# Patient Record
Sex: Male | Born: 1980 | Race: White | Hispanic: No | Marital: Married
Health system: Southern US, Community
[De-identification: ages and names within clinical notes are randomized; demographics above are authoritative.]

---

## 2021-01-09 ENCOUNTER — Other Ambulatory Visit: Payer: Self-pay

## 2021-01-09 ENCOUNTER — Emergency Department (HOSPITAL_COMMUNITY)
Admission: EM | Admit: 2021-01-09 | Discharge: 2021-01-10 | Disposition: A | Discharge status: Home / Self Care | Payer: BC Managed Care – PPO | Attending: Emergency Medicine | Admitting: Emergency Medicine

## 2021-01-09 ENCOUNTER — Emergency Department (HOSPITAL_COMMUNITY): Payer: BC Managed Care – PPO

## 2021-01-09 DIAGNOSIS — R202 Paresthesia of skin: Secondary | ICD-10-CM | POA: Insufficient documentation

## 2021-01-09 DIAGNOSIS — Z950 Presence of cardiac pacemaker: Secondary | ICD-10-CM | POA: Insufficient documentation

## 2021-01-09 DIAGNOSIS — Z7901 Long term (current) use of anticoagulants: Secondary | ICD-10-CM | POA: Insufficient documentation

## 2021-01-09 DIAGNOSIS — R079 Chest pain, unspecified: Secondary | ICD-10-CM | POA: Diagnosis present

## 2021-01-09 DIAGNOSIS — Z20822 Contact with and (suspected) exposure to covid-19: Secondary | ICD-10-CM | POA: Diagnosis not present

## 2021-01-09 LAB — CBC WITH DIFFERENTIAL/PLATELET
Abs Immature Granulocytes: 0.02 10*3/uL (ref 0.00–0.07)
Basophils Absolute: 0 10*3/uL (ref 0.0–0.1)
Basophils Relative: 0 %
Eosinophils Absolute: 0.1 10*3/uL (ref 0.0–0.5)
Eosinophils Relative: 1 %
HCT: 44.2 % (ref 39.0–52.0)
Hemoglobin: 15.3 g/dL (ref 13.0–17.0)
Immature Granulocytes: 0 %
Lymphocytes Relative: 18 %
Lymphs Abs: 1.2 10*3/uL (ref 0.7–4.0)
MCH: 31.3 pg (ref 26.0–34.0)
MCHC: 34.6 g/dL (ref 30.0–36.0)
MCV: 90.4 fL (ref 80.0–100.0)
Monocytes Absolute: 0.5 10*3/uL (ref 0.1–1.0)
Monocytes Relative: 8 %
Neutro Abs: 5 10*3/uL (ref 1.7–7.7)
Neutrophils Relative %: 73 %
Platelets: 145 10*3/uL — ABNORMAL LOW (ref 150–400)
RBC: 4.89 MIL/uL (ref 4.22–5.81)
RDW: 12.3 % (ref 11.5–15.5)
WBC: 6.9 10*3/uL (ref 4.0–10.5)
nRBC: 0 % (ref 0.0–0.2)

## 2021-01-09 LAB — BASIC METABOLIC PANEL
Anion gap: 8 (ref 5–15)
BUN: 15 mg/dL (ref 6–20)
CO2: 25 mmol/L (ref 22–32)
Calcium: 9.3 mg/dL (ref 8.9–10.3)
Chloride: 105 mmol/L (ref 98–111)
Creatinine, Ser: 0.84 mg/dL (ref 0.61–1.24)
GFR, Estimated: >60 mL/min (ref >60)
Glucose, Bld: 107 mg/dL — ABNORMAL HIGH (ref 70–99)
Potassium: 3.8 mmol/L (ref 3.5–5.1)
Sodium: 138 mmol/L (ref 135–145)

## 2021-01-09 LAB — TROPONIN I (HIGH SENSITIVITY): Troponin I (High Sensitivity): 8 ng/L (ref <18)

## 2021-01-09 LAB — RESP PANEL BY RT-PCR (FLU A&B, COVID) ARPGX2
Influenza A by PCR: NEGATIVE
Influenza B by PCR: NEGATIVE
SARS Coronavirus 2 by RT PCR: NEGATIVE

## 2021-01-09 NOTE — ED Provider Notes (Signed)
Emergency Medicine Provider Triage Evaluation Note  Christian Hopkins , a 40 y.o. male  was evaluated in triage.  Pt complains of chest pain.  States that for the last week and a half he has had heaviness to his left upper chest.  Patient has also had tingling sensation to his left arm over this time as well.  Symptoms have been constant.  No aggravating or alleviating factors.  Denies any associated shortness of breath.  States that 2 days ago he developed some nausea, vomiting, decreased appetite, chills, and subjective fevers.  Patient reached out to his cardiologist who recommended coming to the emergency department for assessment.  Review of Systems  Positive: Chest pain, tingling sensation to left arm, nausea, vomiting, decreased appetite, chills Negative: Shortness of breath, syncope  Physical Exam  BP (!) 148/82 (BP Location: Left Arm)    Pulse (!) 57    Temp 98.8 F (37.1 C) (Oral)    Resp 16    Ht 6' (1.829 m)    Wt 77.1 kg    SpO2 99%    BMI 23.06 kg/m  Gen:   Awake, no distress   Resp:  Normal effort, lungs clear to auscultation bilaterally MSK:   Moves extremities without difficulty  Other:  Swelling to left breast.  No tenderness to chest wall.  Medical Decision Making  Medically screening exam initiated at 9:49 PM.  Appropriate orders placed.  Christian Hopkins was informed that the remainder of the evaluation will be completed by another provider, this initial triage assessment does not replace that evaluation, and the importance of remaining in the ED until their evaluation is complete.  ACS work-up initiated   Berneice Heinrich 01/09/21 2151    Glendora Score, MD 01/09/21 [phone]

## 2021-01-09 NOTE — ED Triage Notes (Signed)
Pt c/o left sided chest pain for the past week. Pt states he has a pacemaker in place. Pt also reports nausea and no appetite. Pt states he called his cardiologist who recommended him to come to the ED.

## 2021-01-10 ENCOUNTER — Emergency Department (HOSPITAL_COMMUNITY): Payer: BC Managed Care – PPO

## 2021-01-10 LAB — TROPONIN I (HIGH SENSITIVITY): Troponin I (High Sensitivity): 10 ng/L (ref <18)

## 2021-01-10 NOTE — ED Provider Notes (Signed)
[facility] EMERGENCY DEPARTMENT Provider Note   CSN: [id number] Arrival date & time: 01/09/21  2129     History Chief Complaint  Patient presents with   Chest Pain    Christian Hopkins is a 40 y.o. male.  The history is provided by the patient.  Chest Pain He has history of congenital heart disease, permanent pacemaker, chronic anticoagulation on rivaroxaban, and comes in because of some numbness in his left arm which has been present for the last 2 weeks.  Numbness has been constant and involves the entire arm.  There is no numbness of the face or leg.  Nothing makes it better, nothing makes it worse.  He also states that he sometimes can feel a flutter where his pacemaker is.  This is intermittent and follows no particular pattern.  2 days ago, he had an episode of chills and possible low-grade fever with associated nausea and vomiting.  He has felt well since then.  His wife noted some swelling around the pacemaker pocket but without any redness or warmth and he denies any pain there.  He called his cardiologist who recommended he come to the emergency department for evaluation.   No past medical history on file.  There are no problems to display for this patient.   ** The histories are not reviewed yet. Please review them in the "History" navigator section and refresh this SmartLink.     No family history on file.     Home Medications Prior to Admission medications   Not on File    Allergies    Bactrim [sulfamethoxazole-trimethoprim]  Review of Systems   Review of Systems  Cardiovascular:  Positive for chest pain.  All other systems reviewed and are negative.  Physical Exam Updated Vital Signs BP 124/68 (BP Location: Left Arm)    Pulse (!) 56    Temp 98.2 F (36.8 C) (Oral)    Resp 16    Ht 6' (1.829 m)    Wt 77.1 kg    SpO2 100%    BMI 23.06 kg/m   Physical Exam Vitals and nursing note reviewed.  40 year old male, resting comfortably and in  no acute distress. Vital signs are normal. Oxygen saturation is 100%, which is normal. Head is normocephalic and atraumatic. PERRLA, EOMI. Oropharynx is clear. Neck is nontender and supple without adenopathy or JVD. Back is nontender and there is no CVA tenderness. Lungs are clear without rales, wheezes, or rhonchi. Chest: There is slight puffiness around the pacemaker pocket without any erythema or warmth or tenderness. Heart has regular rate and rhythm without murmur. Abdomen is soft, flat, nontender without masses or hepatosplenomegaly and peristalsis is normoactive. 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  Remainder of extremity exam is normal. Skin is warm and dry without rash. Neurologic: Mental status is normal, cranial nerves are intact, strength is 5/5 in all 4 extremities with exception of the contracture at the left wrist.  Sensation is normal throughout.  ED Results / Procedures / Treatments   Labs (all labs ordered are listed, but only abnormal results are displayed) Labs Reviewed  BASIC METABOLIC PANEL - Abnormal; Notable for the following components:      Result Value   Glucose, Bld 107 (*)    All other components within normal limits  CBC WITH DIFFERENTIAL/PLATELET - Abnormal; Notable for the following components:   Platelets 145 (*)    All other components within normal limits  RESP PANEL BY RT-PCR (  FLU A&B, COVID) ARPGX2  TROPONIN I (HIGH SENSITIVITY)  TROPONIN I (HIGH SENSITIVITY)    EKG EKG Interpretation  Date/Time:  Thursday January 09 2021 21:37:03 EST Ventricular Rate:  79 PR Interval:    QRS Duration: 168 QT Interval:  436 QTC Calculation: 499 R Axis:   -76 Text Interpretation: Atrial-sensed ventricular-paced rhythm with occasional Premature ventricular complexes Abnormal ECG No old tracing to compare Confirmed by Dione Booze ([id number]) on 01/10/2021 1:51:44 AM  Radiology DG Chest 2 View  Result Date: 01/09/2021 CLINICAL DATA:  Chest  pain. EXAM: CHEST - 2 VIEW COMPARISON:  None. FINDINGS: Left-sided pacemaker is present. Pacer wires are seen in the anterior lower chest. Sternotomy wires are also present. The heart size and mediastinal contours are within normal limits. Both lungs are clear. The visualized skeletal structures are unremarkable. IMPRESSION: No active cardiopulmonary disease. Electronically Signed   By: Darliss Cheney M.D.   On: 01/09/2021 22:09   CT Head Wo Contrast  Result Date: 01/10/2021 CLINICAL DATA:  Neuro deficit, acute, stroke suspected. History of prior stroke. EXAM: CT HEAD WITHOUT CONTRAST TECHNIQUE: Contiguous axial images were obtained from the base of the skull through the vertex without intravenous contrast. COMPARISON:  None. FINDINGS: Brain: No acute hemorrhage, midline shift or mass effect. No extra-axial fluid collection. There is encephalomalacia in the parietal lobe on the right. Gray-white matter differentiation is within normal limits and there is no hydrocephalus. Vascular: No hyperdense vessel or unexpected calcification. Skull: Normal. Negative for fracture or focal lesion. Sinuses/Orbits: No acute finding. Other: None. IMPRESSION: 1. No acute intracranial process. 2. 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 Comparison with older imaging studies or MRI is recommended for follow-up. Electronically Signed   By: Thornell Sartorius M.D.   On: 01/10/2021 03:18    Procedures Procedures   Medications Ordered in ED Medications - No data to display  ED Course  I have reviewed the triage vital signs and the nursing notes.  Pertinent labs & imaging results that were available during my care of the patient were reviewed by me and considered in my medical decision making (see chart for details).   MDM Rules/Calculators/A&P                         Paresthesias of the left arm.  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  ECG shows 100% pacemaker dependence.  Chest x-ray shows presence of pacemaker, no acute process.  Labs show borderline thrombocytopenia which is not felt to be clinically significant.  Troponin is normal x2.  I suspect that the has a nerve entrapment as a cause of his paresthesia, but will send for CT scan to rule out recent stroke.  Because of his pacemaker, he is not a candidate for MRI scans.  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  CT scan shows no evidence of recent stroke, encephalomalacia related to stroke he had in infancy.  He is referred back to his cardiologist regarding his pacemaker, and is referred to neurology for evaluation of his paresthesias.  Final Clinical Impression(s) / ED Diagnoses Final diagnoses:  Paresthesia of left arm  Chronic anticoagulation    Rx / DC Orders ED Discharge Orders     None        Preston Fleeting,  Onalee Hua, MD 01/10/21 [phone]

## 2021-01-10 NOTE — ED Notes (Signed)
RN reviewed discharge instructions with pt. Pt verbalized understanding and had no further questions. VSS upon discharge.  

## 2021-01-10 NOTE — Discharge Instructions (Signed)
Your evaluation today did not show any sign of any serious conditions.  However, the cause for your numbness is not clear.  Please follow-up with a neurologist for further evaluation.  Please follow-up with your cardiologist regarding the swelling around your pacemaker and the funny feeling you have been having around your pacemaker.  Return if you are having any problems.

## 2021-01-10 NOTE — ED Notes (Signed)
Pts pacemaker is set at 50 BPM

## 2022-12-26 IMAGING — CR DG CHEST 2V
2 series · 2 of 2 positions shown · non-contrast
Comparison: None.

CLINICAL DATA: Chest pain.

EXAM:
CHEST - 2 VIEW

[chest pa]
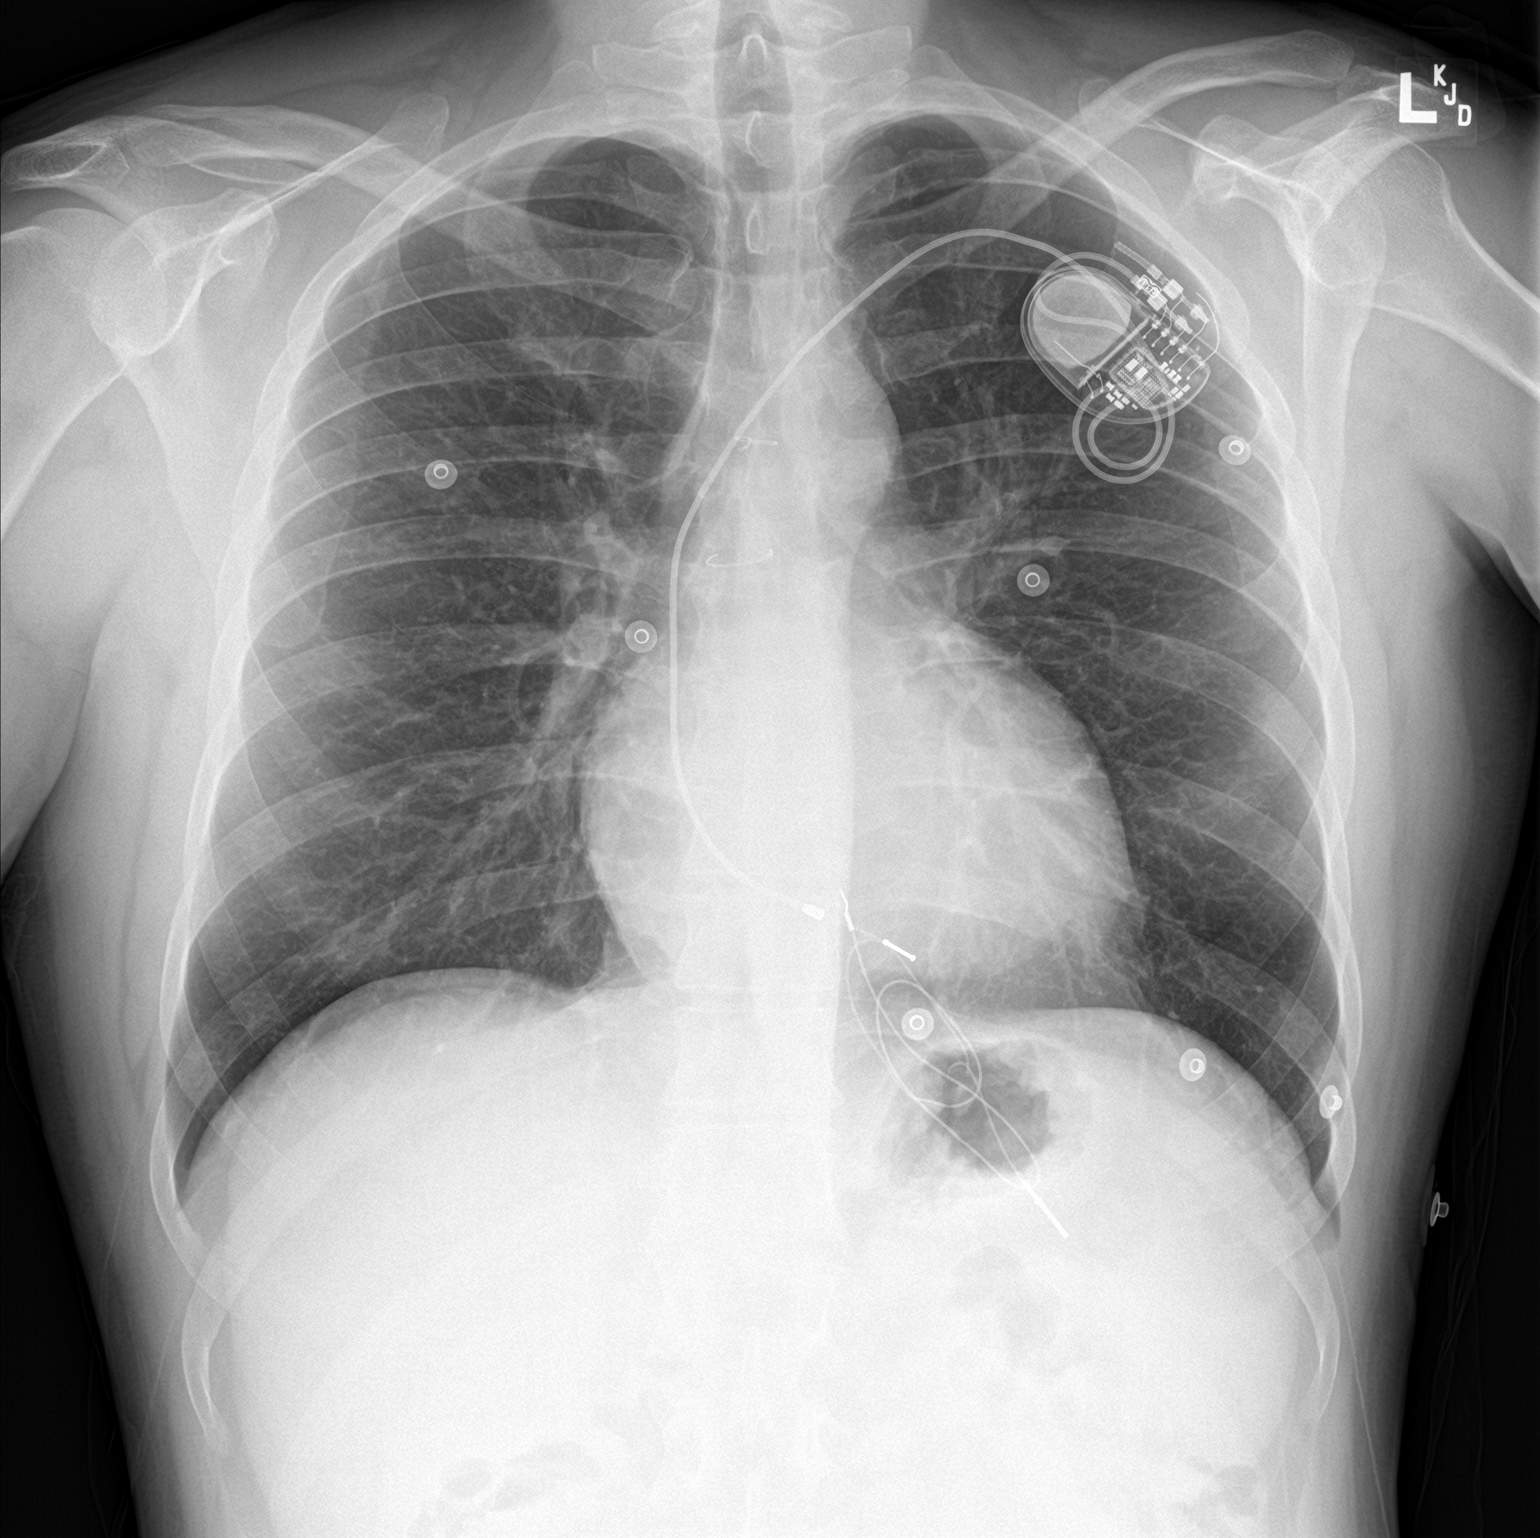

[chest lat]
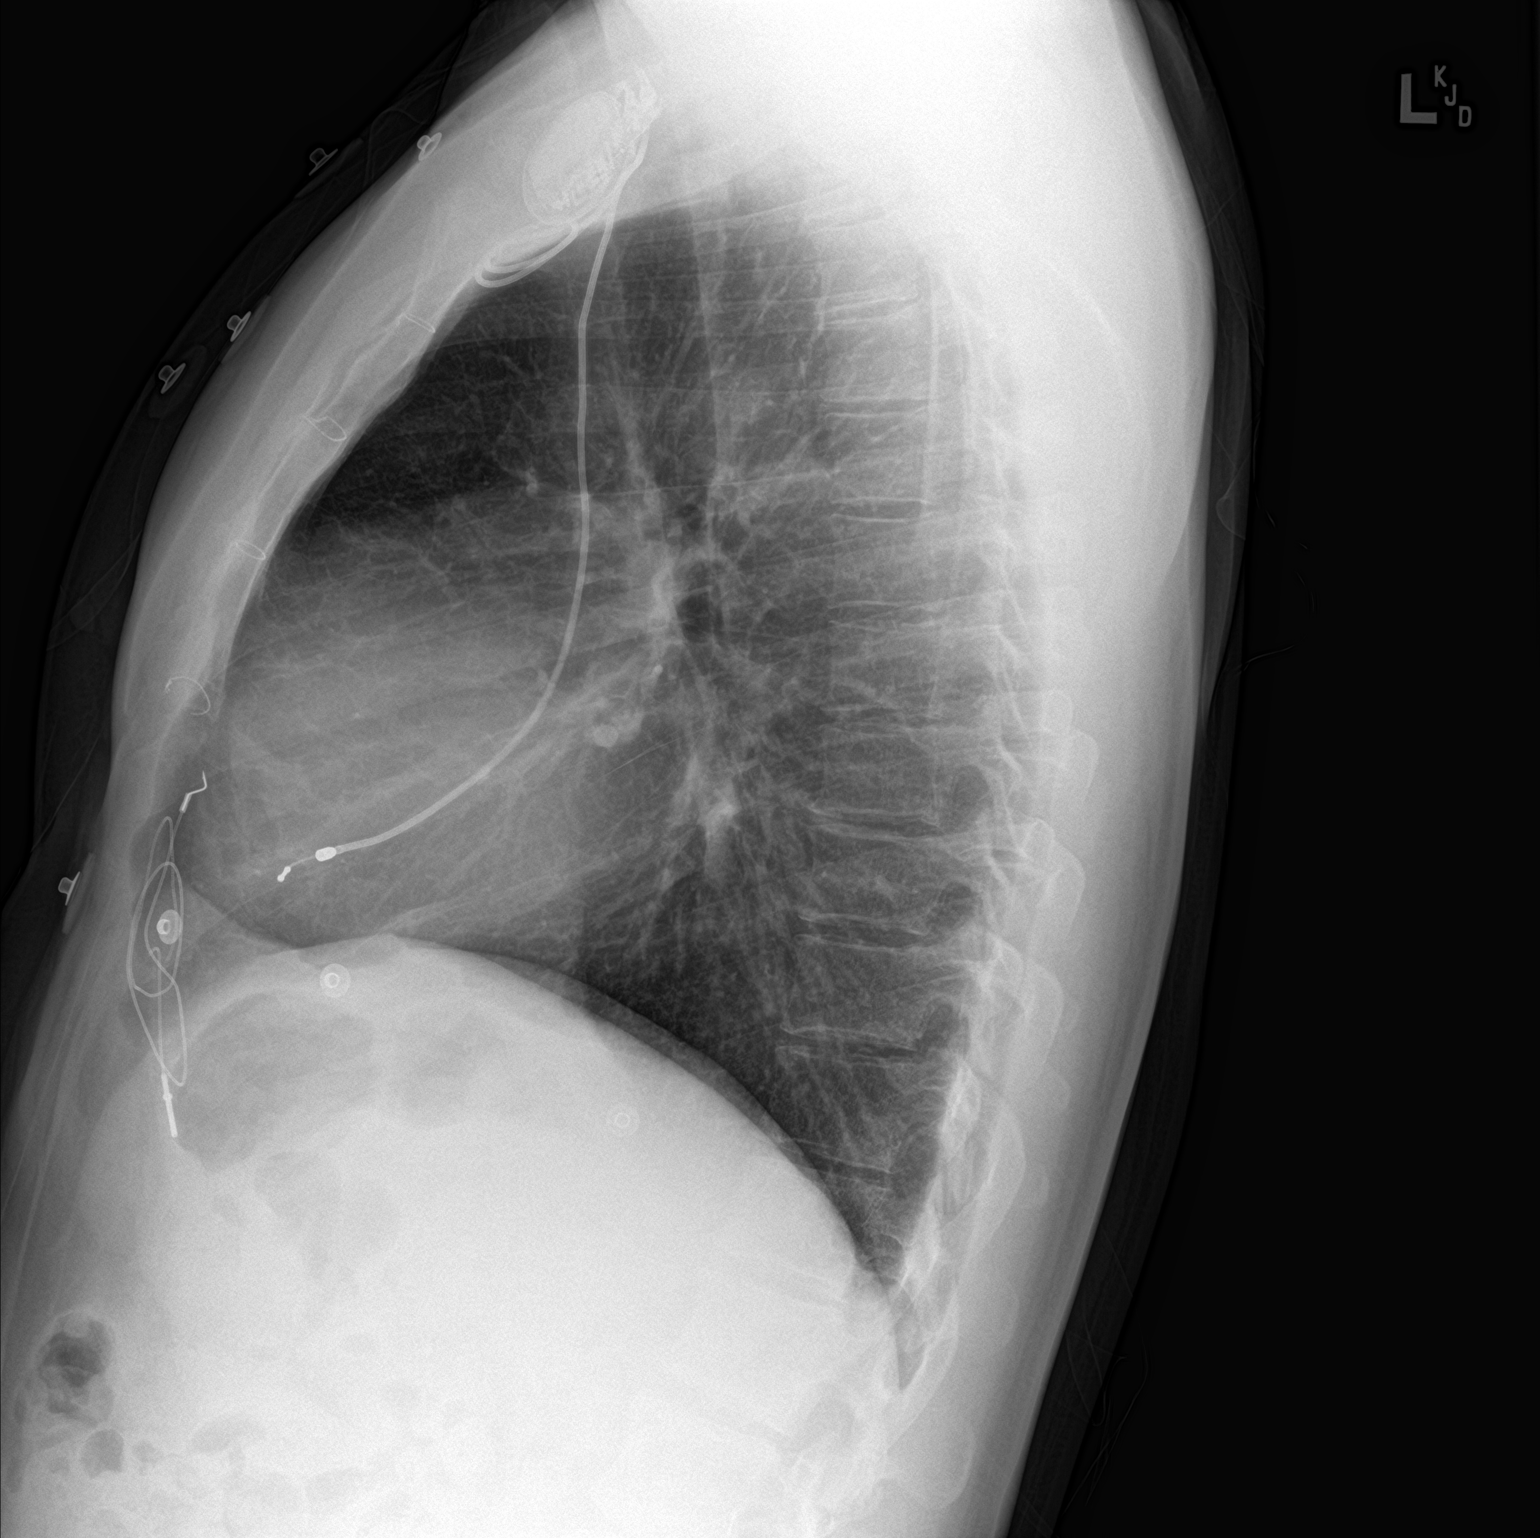

[2 of 2 positions shown; findings below may reference images not displayed]

FINDINGS: Left-sided pacemaker is present. Pacer wires are seen in the
anterior lower chest. Sternotomy wires are also present. The heart
size and mediastinal contours are within normal limits. Both lungs
are clear. The visualized skeletal structures are unremarkable.
IMPRESSION: No active cardiopulmonary disease.

## 2022-12-27 IMAGING — CT CT HEAD W/O CM
4 series · 16 of 47 positions shown, 18 images · non-contrast
Comparison: None.

CLINICAL DATA: Neuro deficit, acute, stroke suspected. History of
prior stroke.

EXAM:
CT HEAD WITHOUT CONTRAST
TECHNIQUE: Contiguous axial images were obtained from the base of the skull
through the vertex without intravenous contrast.

[Series 3: head wo · axial · 0.41mm/px · z∈[-72,+38]mm · 6 of 28 slices shown, 8 images]
[im 4/28  brain]
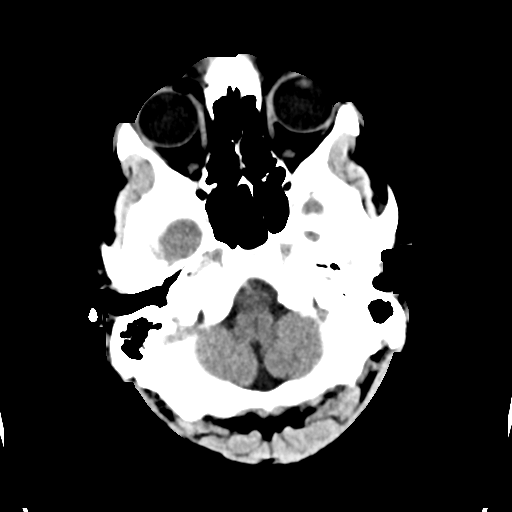
[im 4/28  bone]
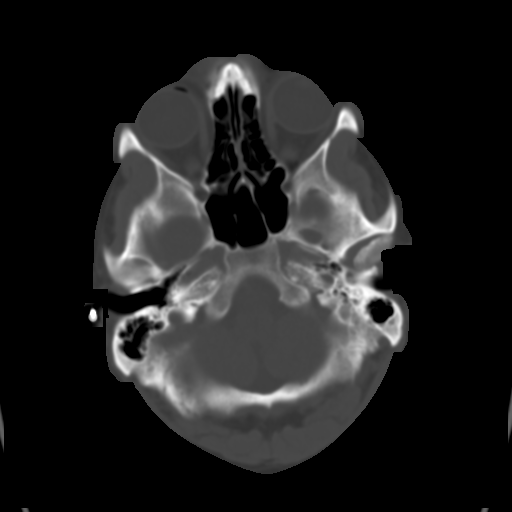
[im 8/28  brain]
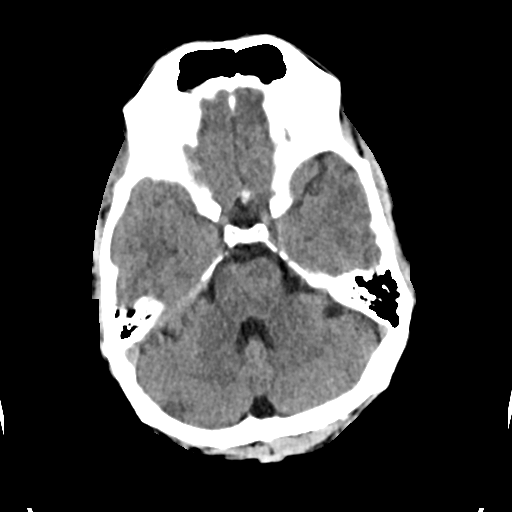
[im 12/28  brain]
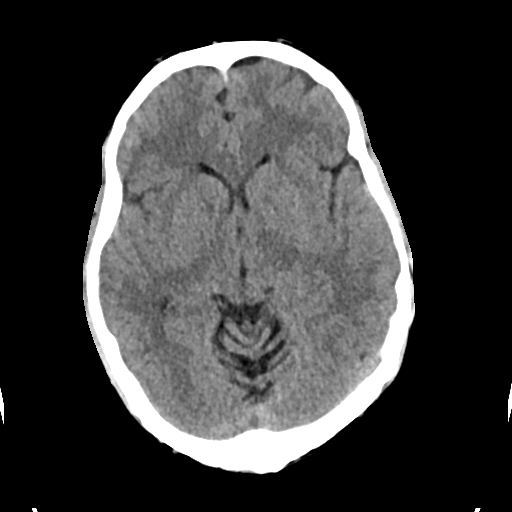
[im 16/28  brain]
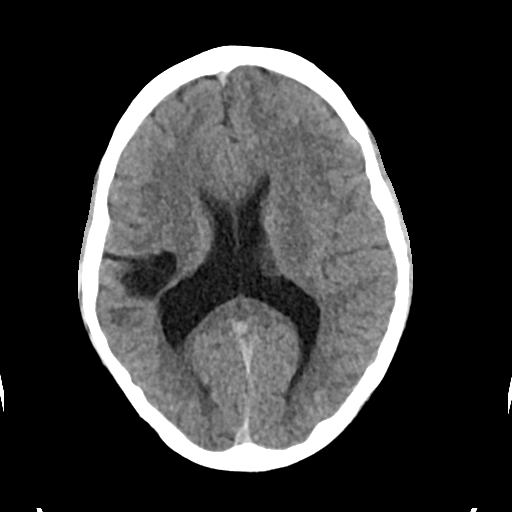
[im 20/28  brain]
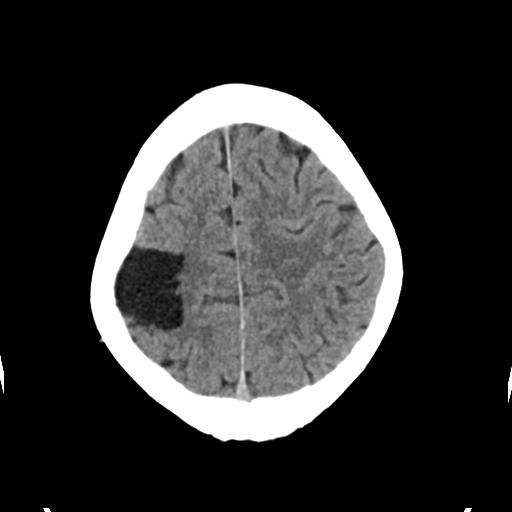
[im 20/28  bone]
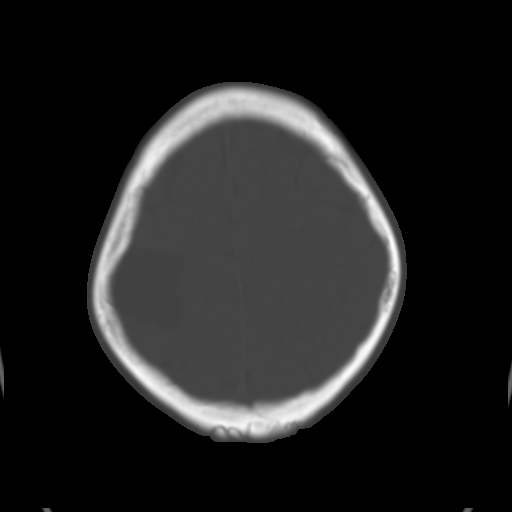
[im 24/28  brain]
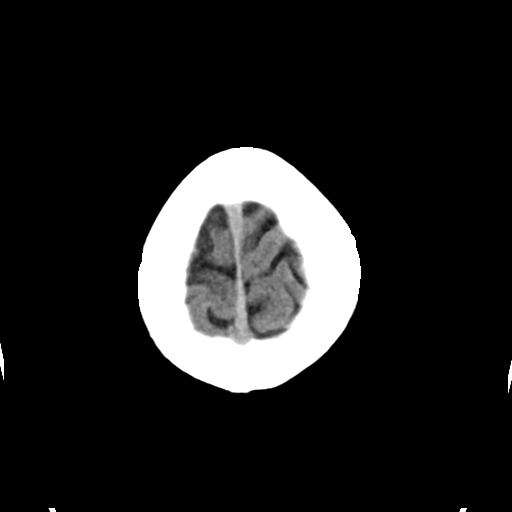

[Series 4: head bone · axial · 0.41mm/px · z∈[-73,-25]mm · 4 of 75 slices shown]
[im 8/75  bone]
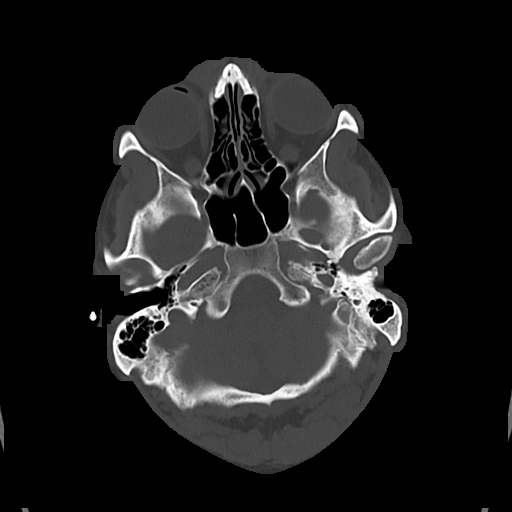
[im 15/75  bone]
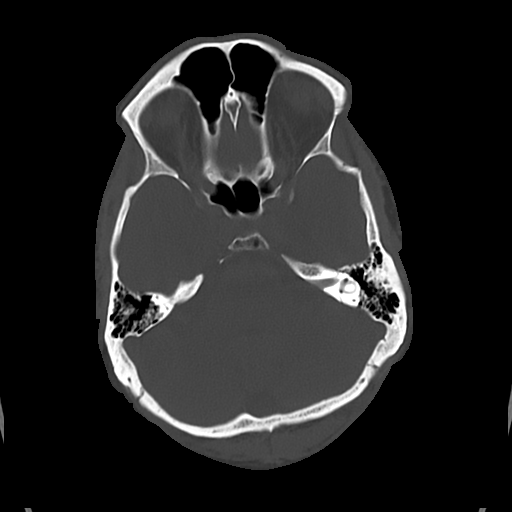
[im 25/75  bone]
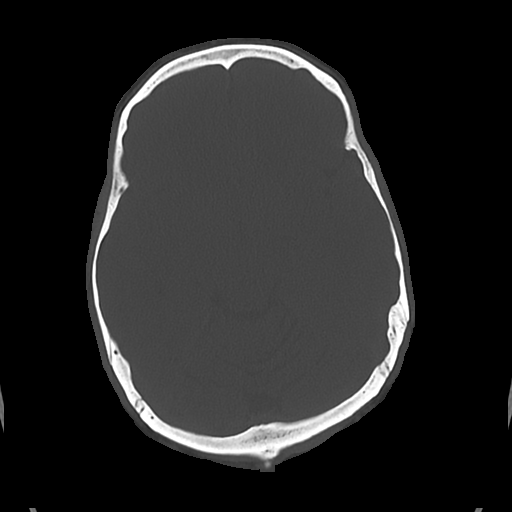
[im 32/75  bone]
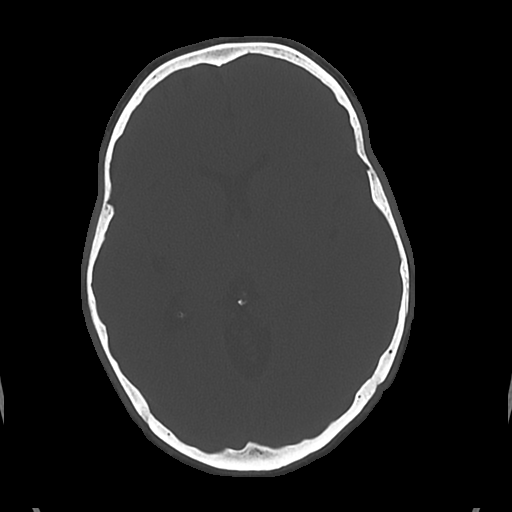

[Series 5: cor soft · coronal · 0.29mm/px · 3 of 66 slices shown]
[im 22/66  brain]
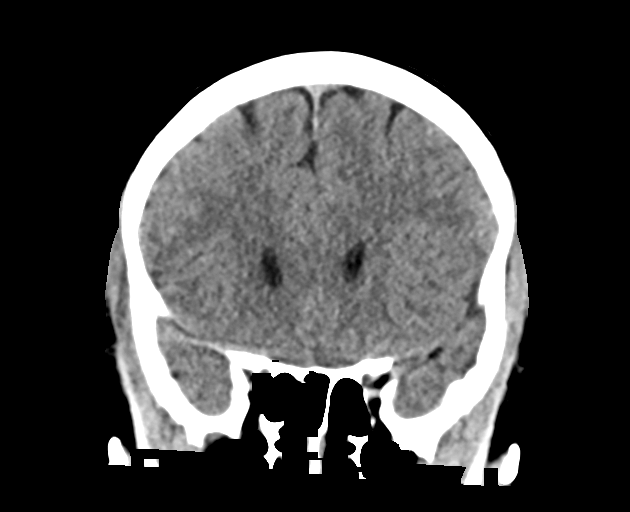
[im 29/66  brain]
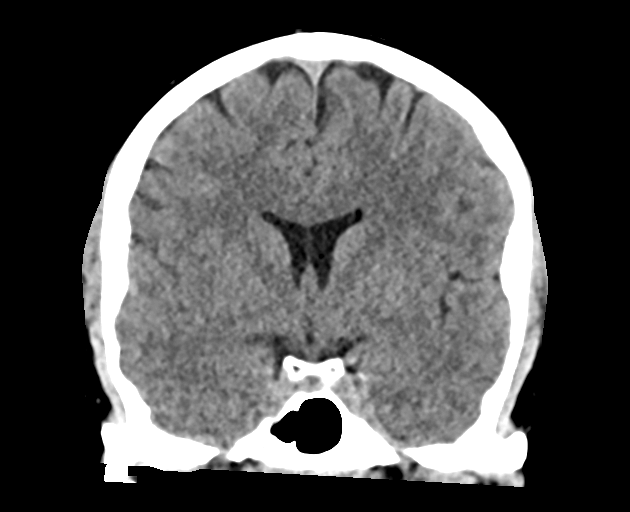
[im 37/66  brain]
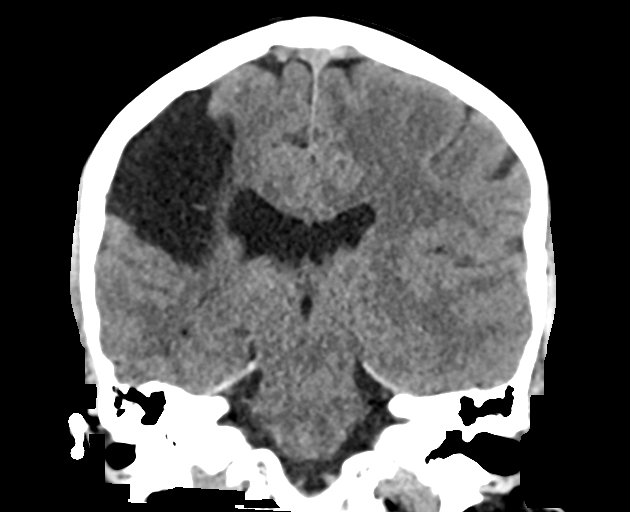

[Series 6: sag soft · sagittal · 0.29mm/px · 3 of 55 slices shown]
[im 19/55  brain]
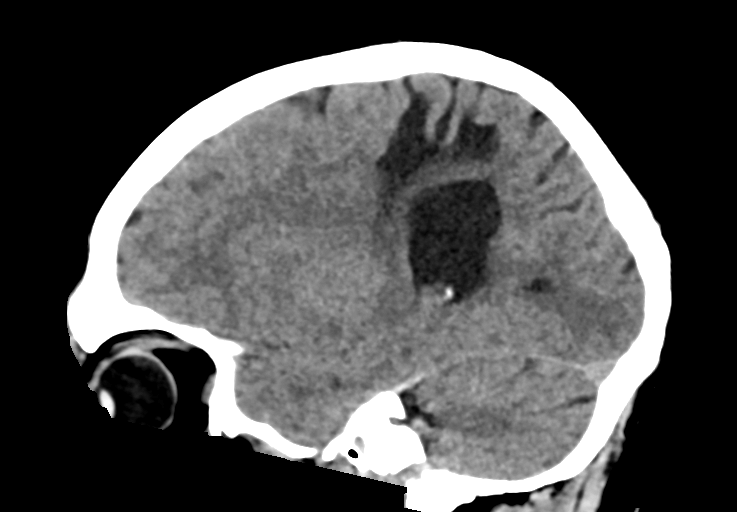
[im 28/55  brain]
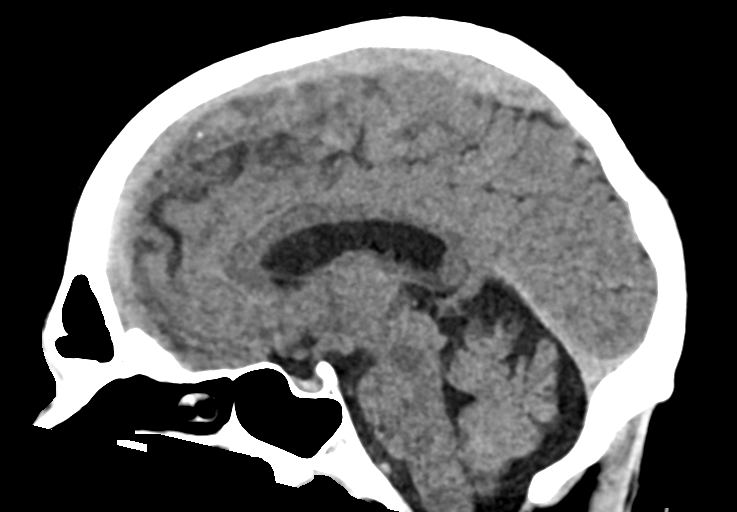
[im 37/55  brain]
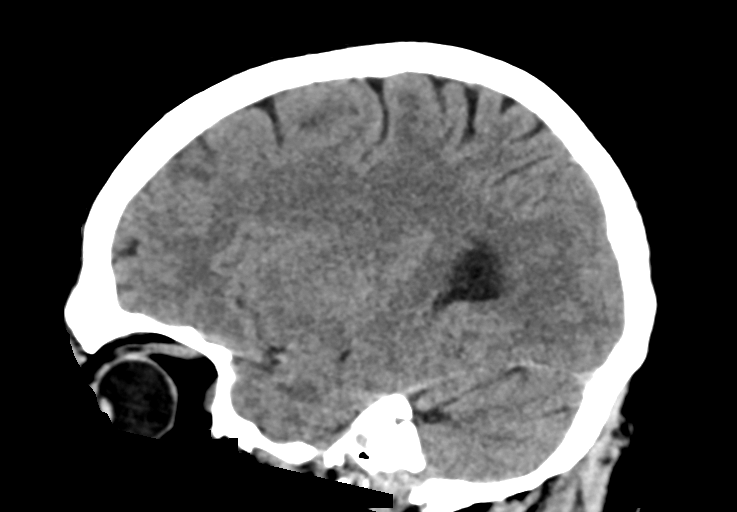

[16 of 47 positions shown; findings below may reference images not displayed]

FINDINGS: Brain: No acute hemorrhage, midline shift or mass effect. No
extra-axial fluid collection. There is encephalomalacia in the
parietal lobe on the right. Gray-white matter differentiation is
within normal limits and there is no hydrocephalus.

Vascular: No hyperdense vessel or unexpected calcification.

Skull: Normal. Negative for fracture or focal lesion.

Sinuses/Orbits: No acute finding.

Other: None.
IMPRESSION: 1. No acute intracranial process.
2. Encephalomalacia in the parietal lobe on the left, which may be
related to patient's history of stroke. Comparison with older
imaging studies or MRI is recommended for follow-up.
